# Patient Record
Sex: Male | Born: 1995 | Race: White | Hispanic: No | Marital: Single | Smoking: Never smoker
Health system: Southern US, Community
[De-identification: ages and names within clinical notes are randomized; demographics above are authoritative.]

## PROBLEM LIST (undated history)

## (undated) DIAGNOSIS — K2970 Gastritis, unspecified, without bleeding: Secondary | ICD-10-CM

## (undated) DIAGNOSIS — R569 Unspecified convulsions: Secondary | ICD-10-CM

## (undated) HISTORY — DX: Unspecified convulsions: R56.9

## (undated) HISTORY — PX: NO PAST SURGERIES: SHX2092

---
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
## 2017-01-13 IMAGING — CT CT HEAD W/O CM
2 series · 16 of 30 positions shown, 20 images · non-contrast
Comparison: None.

CLINICAL DATA: 19-year-old male with new onset of seizure

EXAM:
CT HEAD WITHOUT CONTRAST
TECHNIQUE: Contiguous axial images were obtained from the base of the skull
through the vertex without intravenous contrast.

[Series 201: head w/o, idose (1) · axial · non-contrast · 0.48mm/px · z∈[+73,+198]mm · 13 of 31 slices shown, 17 images]
[im 3/31  brain]
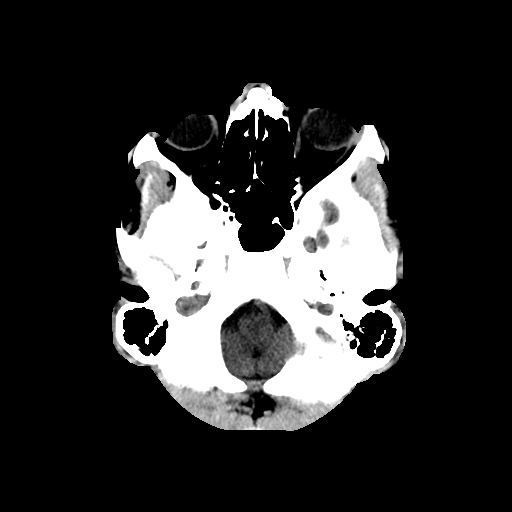
[im 3/31  bone]
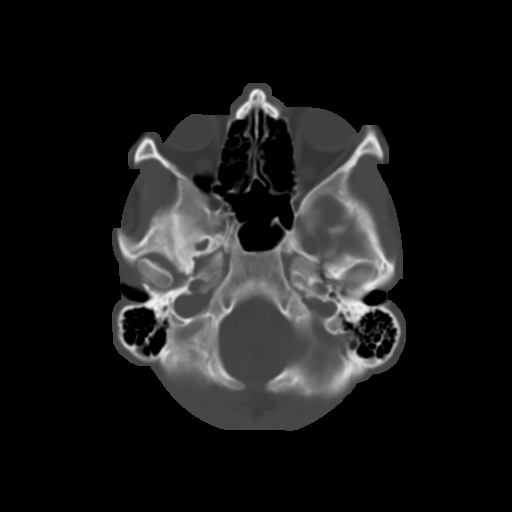
[im 5/31  brain]
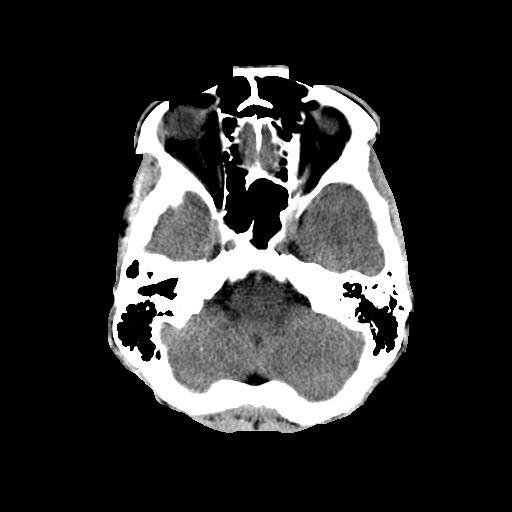
[im 7/31  brain]
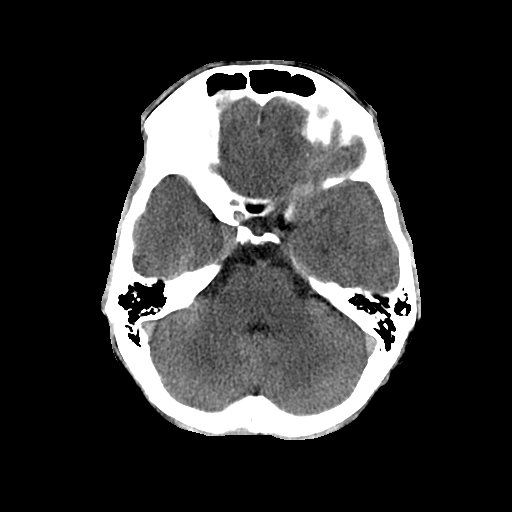
[im 9/31  brain]
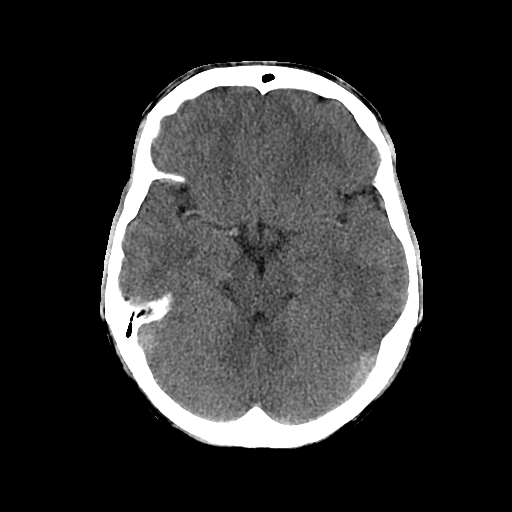
[im 11/31  brain]
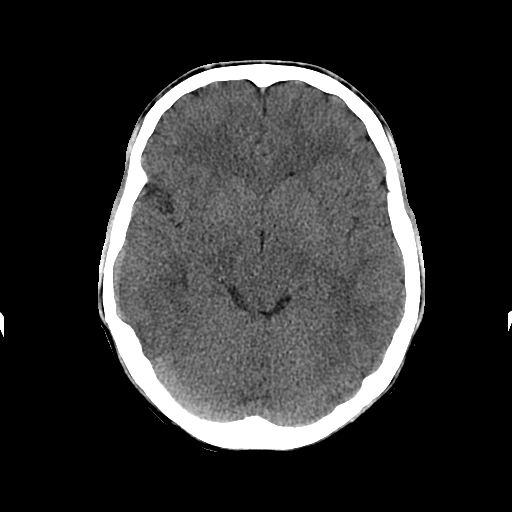
[im 11/31  bone]
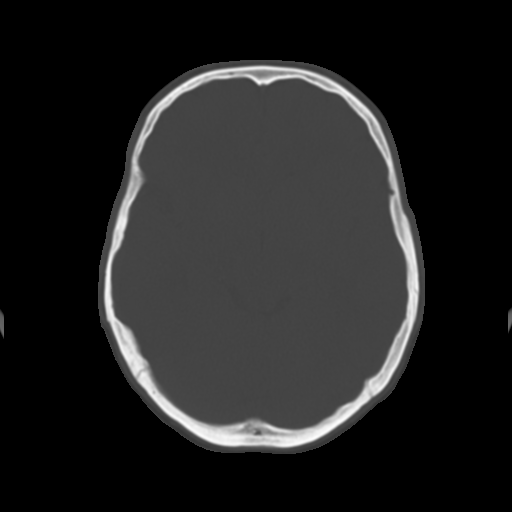
[im 13/31  brain]
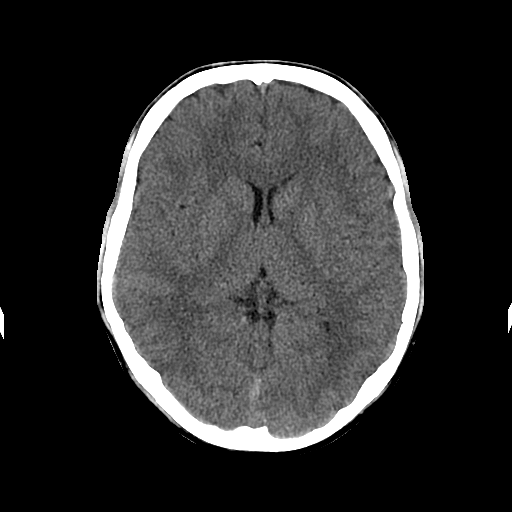
[im 16/31  brain]
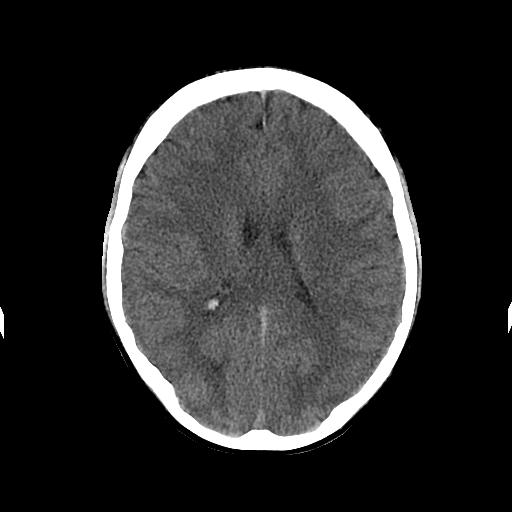
[im 18/31  brain]
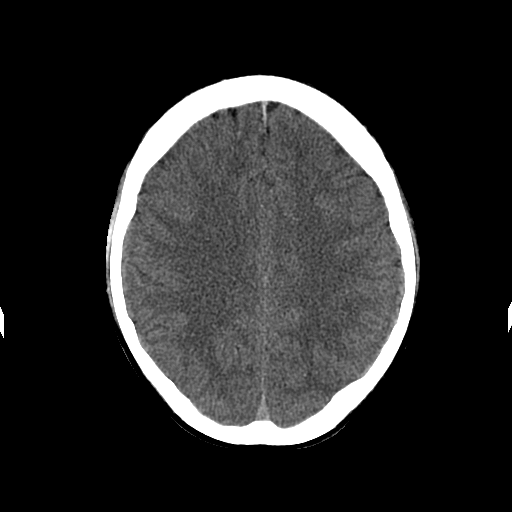
[im 20/31  brain]
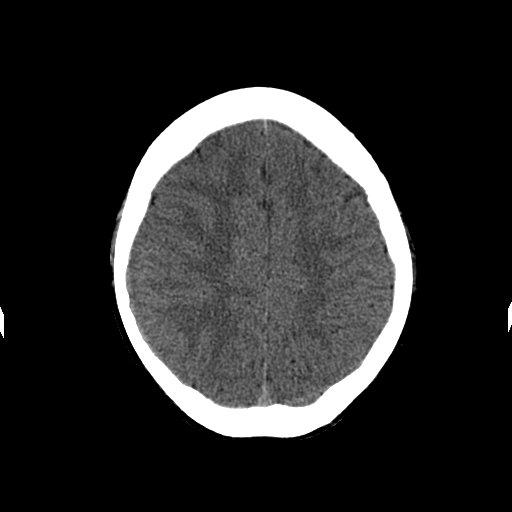
[im 20/31  bone]
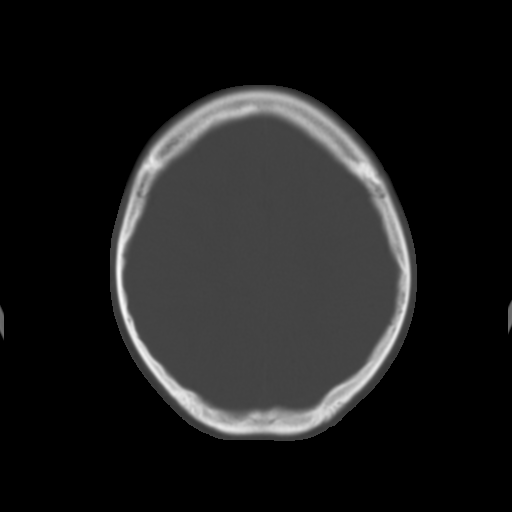
[im 22/31  brain]
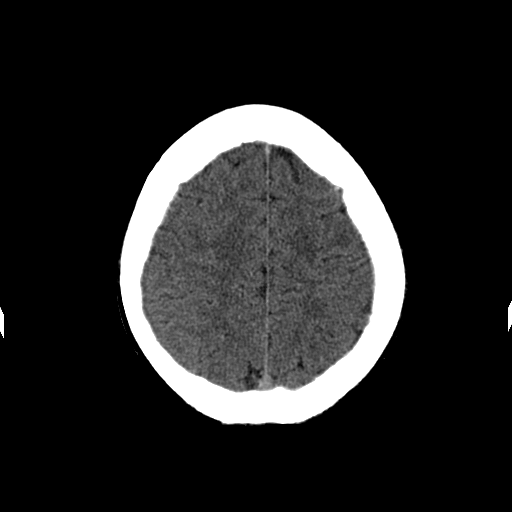
[im 24/31  brain]
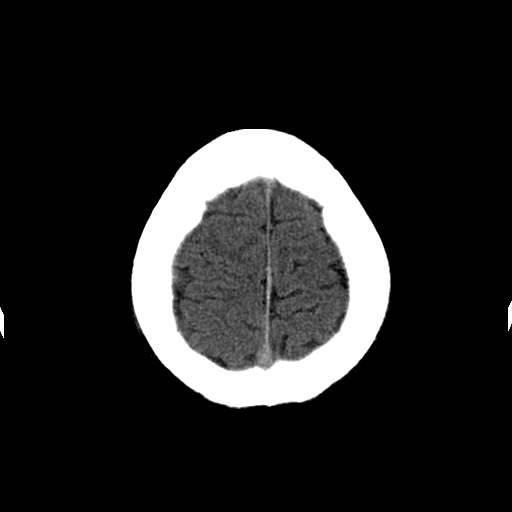
[im 26/31  brain]
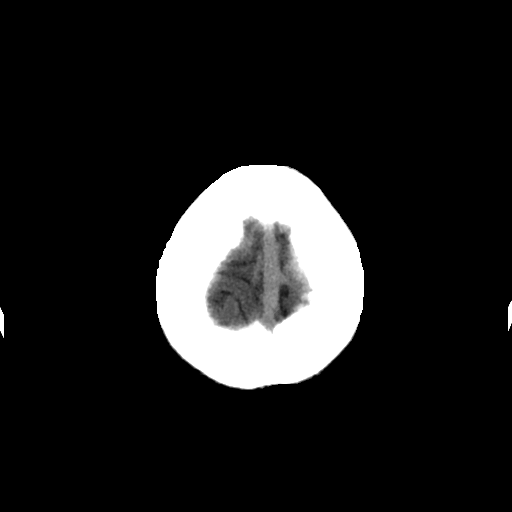
[im 28/31  brain]
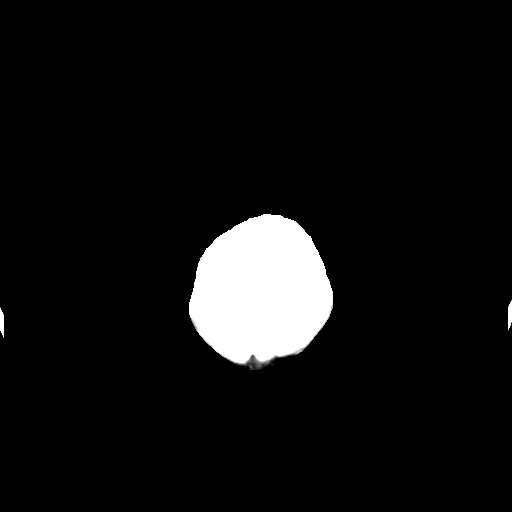
[im 28/31  bone]
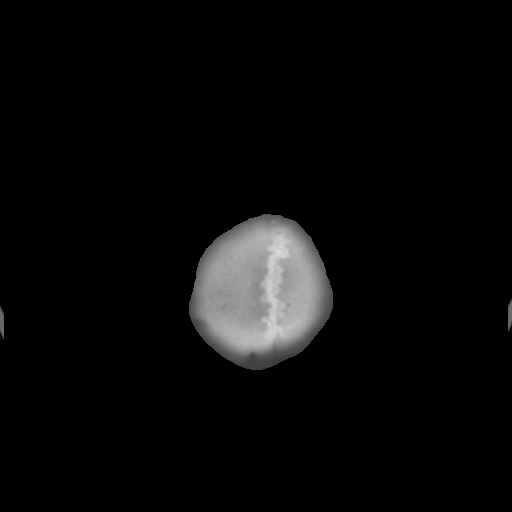

[Series 202: head w/o bone, idose (1) · axial · non-contrast · 0.48mm/px · z∈[+73,+113]mm · 3 of 31 slices shown]
[im 3/31  bone]
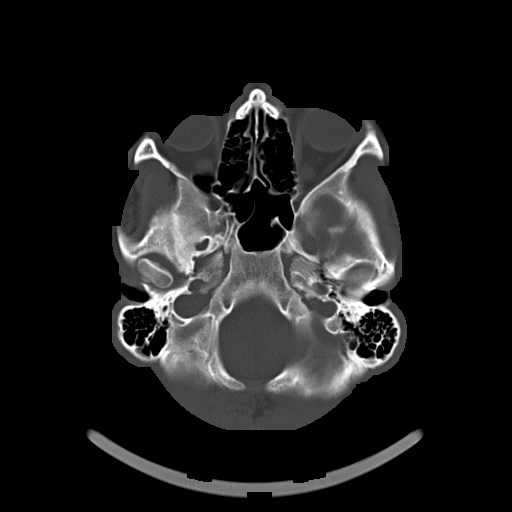
[im 7/31  bone]
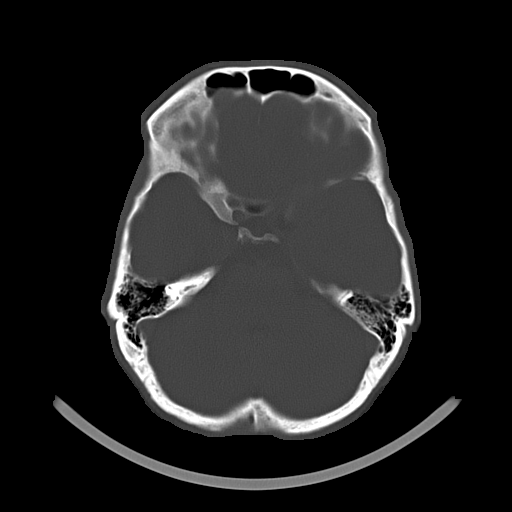
[im 11/31  bone]
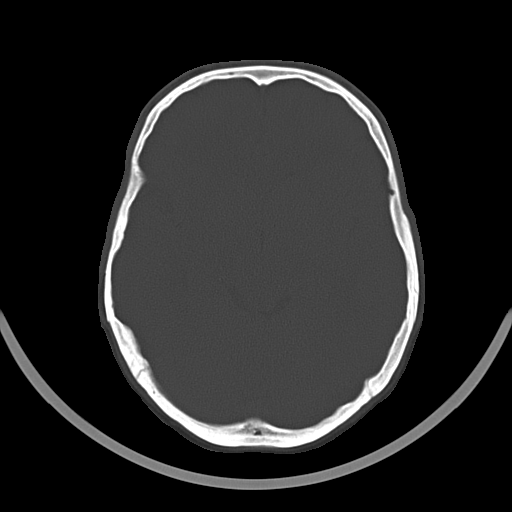

[16 of 30 positions shown; findings below may reference images not displayed]

FINDINGS: The ventricles and the sulci are appropriate in size for the
patient's age. There is no intracranial hemorrhage. No midline shift
or mass effect identified. The gray-white matter differentiation is
preserved.

The visualized paranasal sinuses and mastoid air cells are well
aerated. The calvarium is intact.
IMPRESSION: No acute intracranial pathology.

## 2019-07-13 ENCOUNTER — Ambulatory Visit: Payer: Self-pay | Attending: Internal Medicine

## 2019-07-13 DIAGNOSIS — Z23 Encounter for immunization: Secondary | ICD-10-CM

## 2019-07-13 NOTE — Progress Notes (Signed)
   Covid-19 Vaccination Clinic  Name:  Ronnie Stewart    MRN: [id number] DOB: 1995/12/01  07/13/2019  [name] was observed post Covid-19 immunization for 15 minutes without incident. He was provided with Vaccine Information Sheet and instruction to access the V-Safe system.   [name] was instructed to call 911 with any severe reactions post vaccine: Marland Kitchen Difficulty breathing  . Swelling of face and throat  . A fast heartbeat  . A bad rash all over body  . Dizziness and weakness   Immunizations Administered    Name Date Dose VIS Date Route   Pfizer COVID-19 Vaccine 07/13/2019  3:34 PM 0.3 mL 03/16/2019 Intramuscular   Manufacturer: [employer], [employer]   Lot: [id number]   NDC: [address]-9

## 2019-08-06 ENCOUNTER — Ambulatory Visit: Payer: Self-pay | Attending: Internal Medicine

## 2019-08-06 DIAGNOSIS — Z23 Encounter for immunization: Secondary | ICD-10-CM

## 2019-08-06 NOTE — Progress Notes (Signed)
   Covid-19 Vaccination Clinic  Name:  Ronnie Stewart    MRN: [id number] DOB: December 22, 1995  08/06/2019  Ronnie Stewart was observed post Covid-19 immunization for 15 minutes without incident. He was provided with Vaccine Information Sheet and instruction to access the V-Safe system.   Ronnie Stewart was instructed to call 911 with any severe reactions post vaccine: Marland Kitchen Difficulty breathing  . Swelling of face and throat  . A fast heartbeat  . A bad rash all over body  . Dizziness and weakness   Immunizations Administered    Name Date Dose VIS Date Route   Pfizer COVID-19 Vaccine 08/06/2019 11:15 AM 0.3 mL 05/30/2018 Intramuscular   Manufacturer: [employer], [employer]   Lot: [id number]   NDC: [address]-6
